# Patient Record
Sex: Male | Born: 1945 | Race: Black or African American | Hispanic: No | Marital: Married
Health system: Southern US, Community
[De-identification: ages and names within clinical notes are randomized; demographics above are authoritative.]

## PROBLEM LIST (undated history)

## (undated) DIAGNOSIS — I10 Essential (primary) hypertension: Secondary | ICD-10-CM

## (undated) DIAGNOSIS — E119 Type 2 diabetes mellitus without complications: Secondary | ICD-10-CM

---

## 2016-04-01 ENCOUNTER — Other Ambulatory Visit: Payer: Self-pay | Admitting: Family Medicine

## 2016-04-01 DIAGNOSIS — R42 Dizziness and giddiness: Secondary | ICD-10-CM

## 2016-04-04 ENCOUNTER — Ambulatory Visit
Admission: RE | Admit: 2016-04-04 | Discharge: 2016-04-04 | Disposition: A | Discharge status: Home / Self Care | Payer: Medicare HMO | Source: Ambulatory Visit | Attending: Family Medicine | Admitting: Family Medicine

## 2016-04-04 DIAGNOSIS — I6521 Occlusion and stenosis of right carotid artery: Secondary | ICD-10-CM | POA: Diagnosis not present

## 2016-04-04 DIAGNOSIS — R42 Dizziness and giddiness: Secondary | ICD-10-CM | POA: Insufficient documentation

## 2019-05-31 ENCOUNTER — Other Ambulatory Visit: Payer: Self-pay | Admitting: Family Medicine

## 2019-05-31 ENCOUNTER — Other Ambulatory Visit (HOSPITAL_COMMUNITY): Payer: Self-pay | Admitting: Family Medicine

## 2019-05-31 DIAGNOSIS — K5792 Diverticulitis of intestine, part unspecified, without perforation or abscess without bleeding: Secondary | ICD-10-CM

## 2019-06-01 ENCOUNTER — Ambulatory Visit
Admission: RE | Admit: 2019-06-01 | Discharge: 2019-06-01 | Disposition: A | Discharge status: Home / Self Care | Payer: Medicare HMO | Source: Ambulatory Visit | Attending: Family Medicine | Admitting: Family Medicine

## 2019-06-01 ENCOUNTER — Other Ambulatory Visit: Payer: Self-pay

## 2019-06-01 DIAGNOSIS — K5792 Diverticulitis of intestine, part unspecified, without perforation or abscess without bleeding: Secondary | ICD-10-CM | POA: Diagnosis not present

## 2019-06-01 HISTORY — DX: Essential (primary) hypertension: I10

## 2019-06-01 HISTORY — DX: Type 2 diabetes mellitus without complications: E11.9

## 2019-06-01 LAB — POCT I-STAT CREATININE: Creatinine, Ser: 1.3 mg/dL — ABNORMAL HIGH (ref 0.61–1.24)

## 2019-06-01 MED ORDER — IOHEXOL 300 MG/ML  SOLN
100.0000 mL | Freq: Once | INTRAMUSCULAR | Status: AC | PRN
  Administered 2019-06-01: 100 mL via INTRAVENOUS

## 2019-07-23 ENCOUNTER — Other Ambulatory Visit: Payer: Self-pay

## 2019-07-23 ENCOUNTER — Other Ambulatory Visit
Admission: RE | Admit: 2019-07-23 | Discharge: 2019-07-23 | Disposition: A | Discharge status: Home / Self Care | Payer: Medicare HMO | Source: Ambulatory Visit | Attending: Gastroenterology | Admitting: Gastroenterology

## 2019-07-27 ENCOUNTER — Encounter: Payer: Self-pay | Admitting: Anesthesiology

## 2019-07-27 ENCOUNTER — Ambulatory Visit
Admission: RE | Admit: 2019-07-27 | Discharge: 2019-07-27 | Disposition: A | Discharge status: Home / Self Care | Payer: Medicare HMO | Attending: Gastroenterology | Admitting: Gastroenterology

## 2019-07-27 ENCOUNTER — Other Ambulatory Visit: Payer: Self-pay

## 2019-07-27 ENCOUNTER — Ambulatory Visit: Payer: Medicare HMO | Admitting: Anesthesiology

## 2019-07-27 ENCOUNTER — Encounter
Admission: RE | Disposition: A | Discharge status: Home / Self Care | Payer: Self-pay | Source: Home / Self Care | Attending: Gastroenterology

## 2019-07-27 SURGERY — COLONOSCOPY WITH PROPOFOL
Anesthesia: General

## 2019-07-27 NOTE — Anesthesia Preprocedure Evaluation (Signed)
Anesthesia Evaluation  Patient identified by MRN, date of birth, ID band Patient awake    Reviewed: Allergy & Precautions, NPO status , Patient's Chart, lab work & pertinent test results  Airway Mallampati: II       Dental   Pulmonary neg pulmonary ROS,           Cardiovascular hypertension,      Neuro/Psych negative neurological ROS  negative psych ROS   GI/Hepatic negative GI ROS, Neg liver ROS,   Endo/Other  diabetes  Renal/GU negative Renal ROS  negative genitourinary   Musculoskeletal negative musculoskeletal ROS (+)   Abdominal   Peds negative pediatric ROS (+)  Hematology negative hematology ROS (+)   Anesthesia Other Findings   Reproductive/Obstetrics                             Anesthesia Physical Anesthesia Plan  ASA: II  Anesthesia Plan: General   Post-op Pain Management:    Induction: Intravenous  PONV Risk Score and Plan: Propofol infusion  Airway Management Planned: Nasal Cannula  Additional Equipment:   Intra-op Plan:   Post-operative Plan:   Informed Consent: I have reviewed the patients History and Physical, chart, labs and discussed the procedure including the risks, benefits and alternatives for the proposed anesthesia with the patient or authorized representative who has indicated his/her understanding and acceptance.       Plan Discussed with: CRNA and Surgeon  Anesthesia Plan Comments:         Anesthesia Quick Evaluation

## 2019-07-30 NOTE — H&P (Signed)
Patient did not have a result for his covid-19 test, none apparently found in the computer.  He was offered a quick tes, but did not want to do that and left.

## 2020-03-03 IMAGING — CT CT ABDOMEN AND PELVIS WITH CONTRAST
2 of 5 series · 16 of 46 positions shown, 18 images · IV contrast (APPLIED)
Comparison: None.

CLINICAL DATA: Right lower quadrant pain for 14 days.

EXAM:
CT ABDOMEN AND PELVIS WITH CONTRAST
TECHNIQUE: Multidetector CT imaging of the abdomen and pelvis was performed
using the standard protocol following bolus administration of
intravenous contrast.
CONTRAST:  100mL OMNIPAQUE IOHEXOL 300 MG/ML  SOLN

[Series 2: routine abd/pel with · axial · 0.68mm/px · z∈[-558,-138]mm · 13 of 94 slices shown, 15 images]
[im 5/94  soft-tissue]
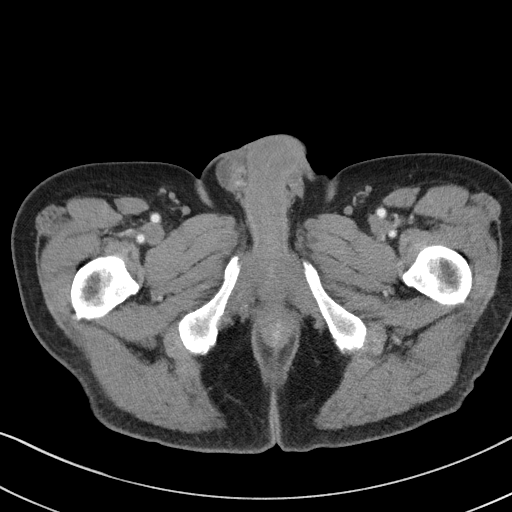
[im 5/94  bone]
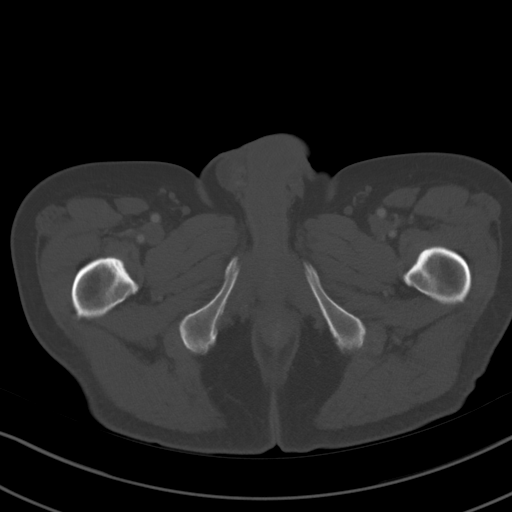
[im 14/94  soft-tissue]
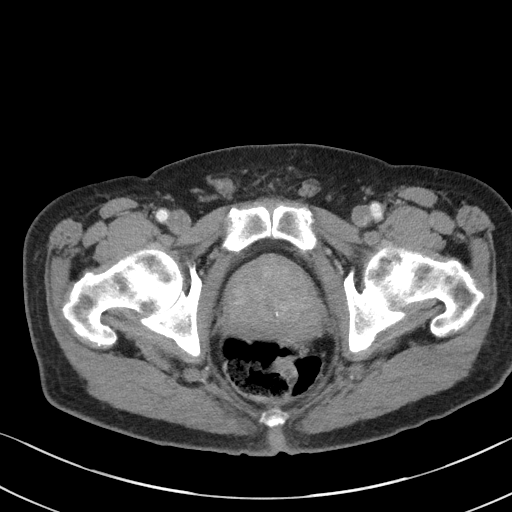
[im 19/94  soft-tissue]
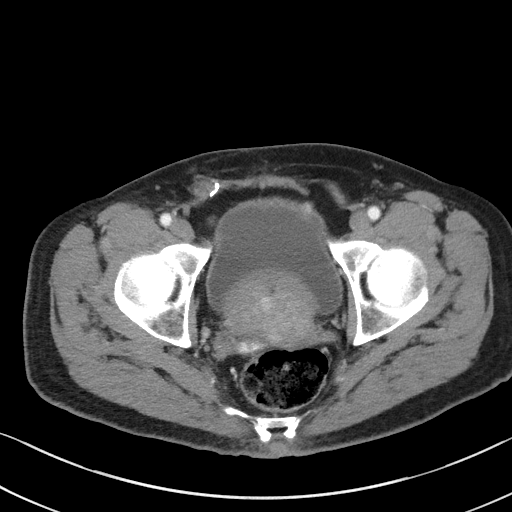
[im 28/94  soft-tissue]
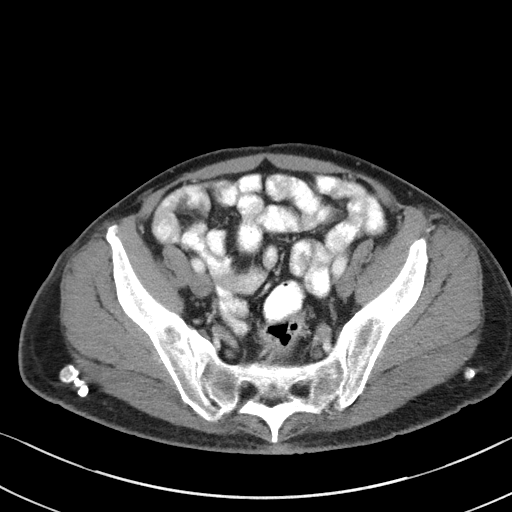
[im 33/94  soft-tissue]
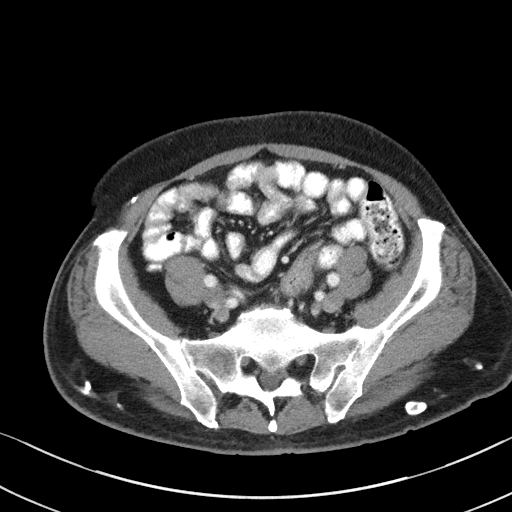
[im 42/94  soft-tissue]
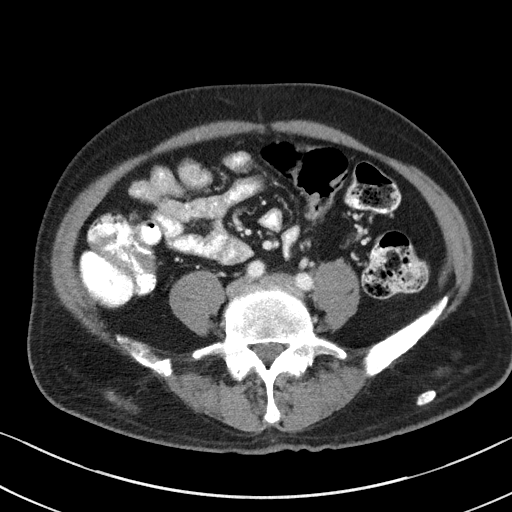
[im 47/94  soft-tissue]
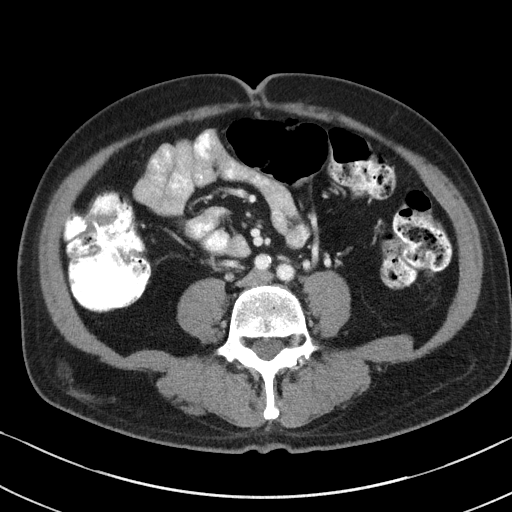
[im 52/94  soft-tissue]
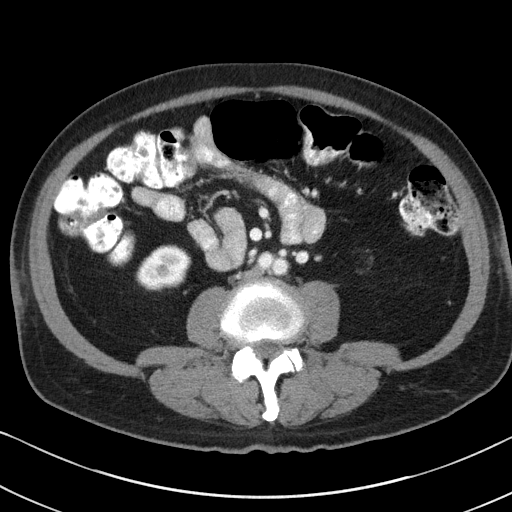
[im 61/94  soft-tissue]
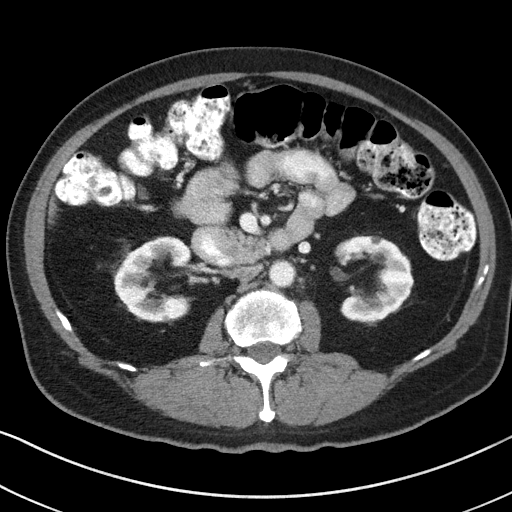
[im 61/94  bone]
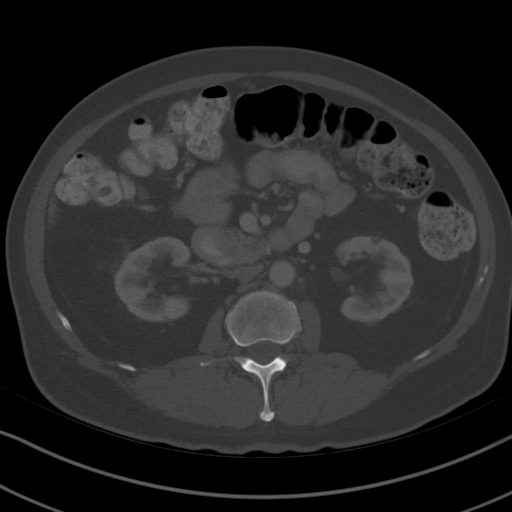
[im 66/94  soft-tissue]
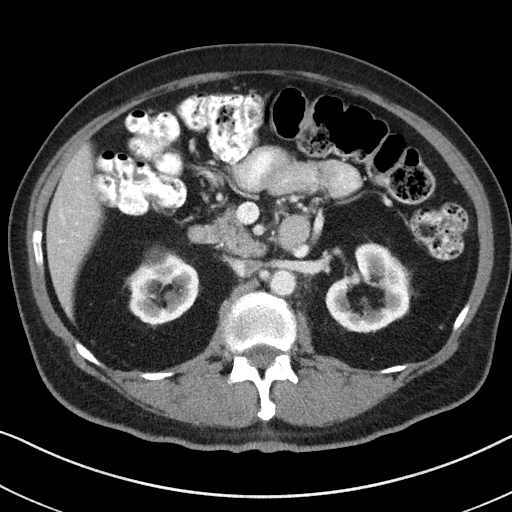
[im 75/94  soft-tissue]
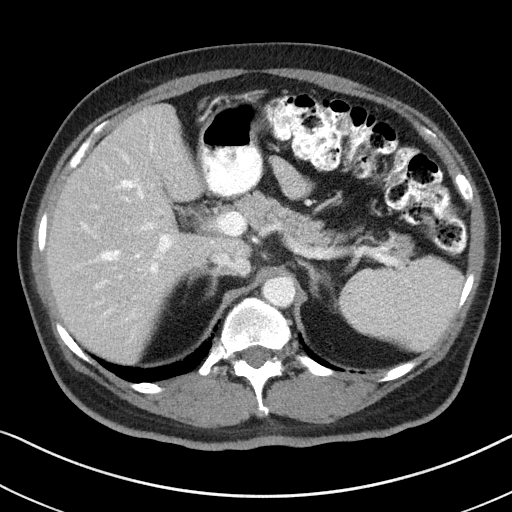
[im 80/94  soft-tissue]
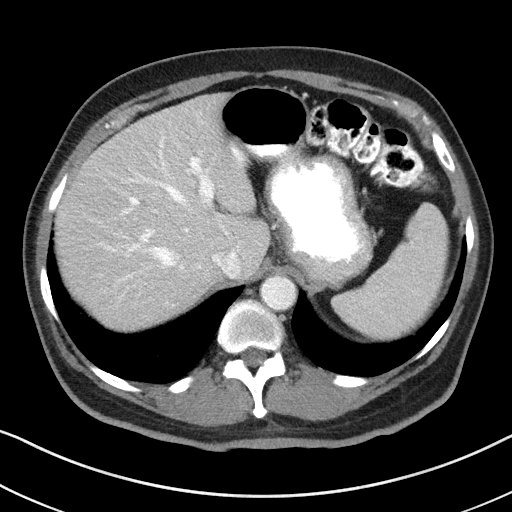
[im 89/94  soft-tissue]
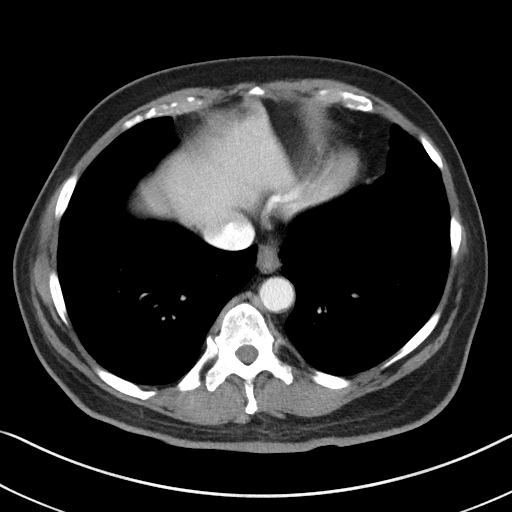

[Series 5: coronal st · coronal · 0.71mm/px · 3 of 95 slices shown]
[im 32/95  soft-tissue]
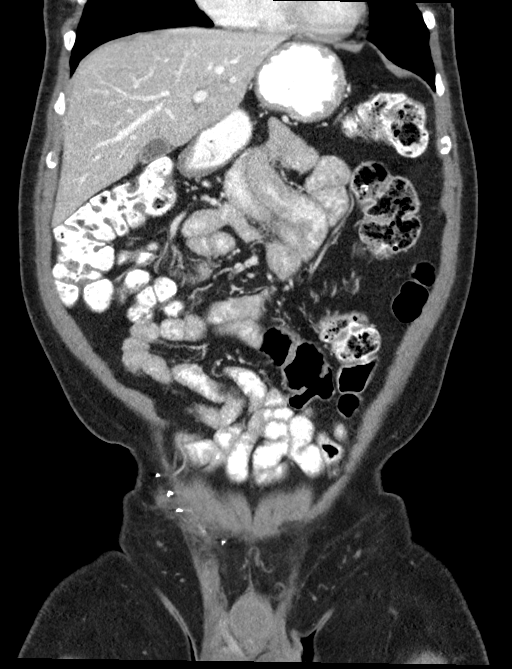
[im 42/95  soft-tissue]
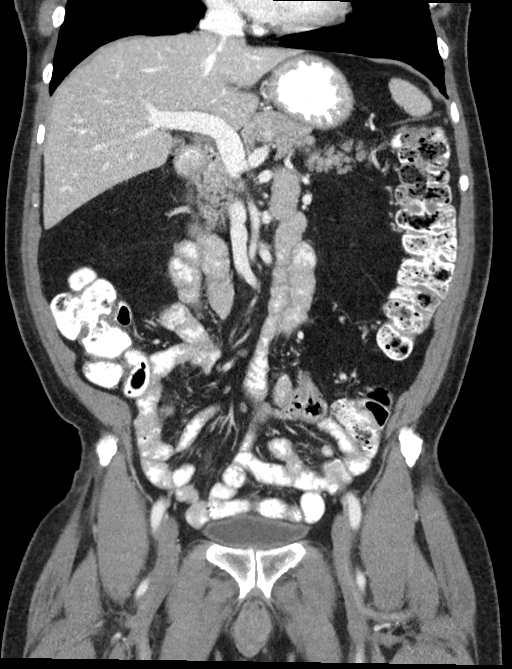
[im 53/95  soft-tissue]
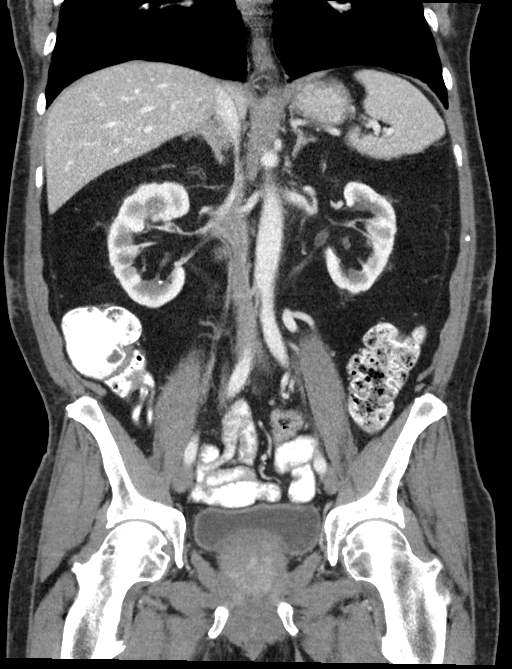

[16 of 46 positions shown; findings below may reference images not displayed]

FINDINGS: Lower chest: No acute abnormality.

Hepatobiliary: No focal liver abnormality is seen. No gallstones,
gallbladder wall thickening, or biliary dilatation.

Pancreas: Unremarkable. No pancreatic ductal dilatation or
surrounding inflammatory changes.

Spleen: Normal in size without focal abnormality.

Adrenals/Urinary Tract: The bilateral adrenal glands are normal.
Simple cysts are identified in bilateral kidneys. There is no
hydronephrosis bilaterally. The bladder is normal.

Stomach/Bowel: Stomach is within normal limits. Appendix appears
normal. No evidence of bowel wall thickening, distention, or
inflammatory changes. Marked bowel content is identified throughout
colon.

Vascular/Lymphatic: Aortic atherosclerosis. No enlarged abdominal or
pelvic lymph nodes.

Reproductive: The prostate gland is enlarged measuring 7.7 x 6.9 cm.

Other: None

Musculoskeletal: No acute abnormality.
IMPRESSION: No acute abnormality identified in the abdomen and pelvis.

The appendix is normal.  There is no bowel obstruction.

Marked bowel content is identified throughout colon. This can be
seen in constipation.

Enlarged prostate gland.

Bilateral kidney cysts.

## 2021-05-09 ENCOUNTER — Other Ambulatory Visit: Payer: Self-pay

## 2021-05-09 ENCOUNTER — Encounter: Payer: Self-pay | Admitting: Emergency Medicine

## 2021-05-09 ENCOUNTER — Emergency Department
Admission: EM | Admit: 2021-05-09 | Discharge: 2021-05-09 | Disposition: A | Discharge status: Home / Self Care | Payer: Medicare HMO | Attending: Student in an Organized Health Care Education/Training Program | Admitting: Student in an Organized Health Care Education/Training Program

## 2021-05-09 ENCOUNTER — Emergency Department: Payer: Medicare HMO

## 2021-05-09 DIAGNOSIS — Z7984 Long term (current) use of oral hypoglycemic drugs: Secondary | ICD-10-CM | POA: Insufficient documentation

## 2021-05-09 DIAGNOSIS — Z79899 Other long term (current) drug therapy: Secondary | ICD-10-CM | POA: Insufficient documentation

## 2021-05-09 DIAGNOSIS — E119 Type 2 diabetes mellitus without complications: Secondary | ICD-10-CM | POA: Insufficient documentation

## 2021-05-09 DIAGNOSIS — Z794 Long term (current) use of insulin: Secondary | ICD-10-CM | POA: Diagnosis not present

## 2021-05-09 DIAGNOSIS — R0789 Other chest pain: Secondary | ICD-10-CM | POA: Diagnosis not present

## 2021-05-09 DIAGNOSIS — I10 Essential (primary) hypertension: Secondary | ICD-10-CM | POA: Insufficient documentation

## 2021-05-09 LAB — CBC
HCT: 39.6 % (ref 39.0–52.0)
Hemoglobin: 14 g/dL (ref 13.0–17.0)
MCH: 32.4 pg (ref 26.0–34.0)
MCHC: 35.4 g/dL (ref 30.0–36.0)
MCV: 91.7 fL (ref 80.0–100.0)
Platelets: 206 10*3/uL (ref 150–400)
RBC: 4.32 MIL/uL (ref 4.22–5.81)
RDW: 11.8 % (ref 11.5–15.5)
WBC: 6.5 10*3/uL (ref 4.0–10.5)
nRBC: 0 % (ref 0.0–0.2)

## 2021-05-09 LAB — BASIC METABOLIC PANEL
Anion gap: 11 (ref 5–15)
BUN: 19 mg/dL (ref 8–23)
CO2: 27 mmol/L (ref 22–32)
Calcium: 9.4 mg/dL (ref 8.9–10.3)
Chloride: 100 mmol/L (ref 98–111)
Creatinine, Ser: 1.6 mg/dL — ABNORMAL HIGH (ref 0.61–1.24)
GFR, Estimated: 44 mL/min — ABNORMAL LOW (ref >60)
Glucose, Bld: 222 mg/dL — ABNORMAL HIGH (ref 70–99)
Potassium: 3.9 mmol/L (ref 3.5–5.1)
Sodium: 138 mmol/L (ref 135–145)

## 2021-05-09 LAB — TROPONIN I (HIGH SENSITIVITY)
Troponin I (High Sensitivity): <2 ng/L (ref <18)
Troponin I (High Sensitivity): <2 ng/L (ref <18)

## 2021-05-09 LAB — D-DIMER, QUANTITATIVE: D-Dimer, Quant: 0.33 ug/mL-FEU (ref 0.00–0.50)

## 2021-05-09 NOTE — ED Provider Notes (Signed)
[facility] Emergency Department Provider Note    Event Date/Time   First MD Initiated Contact with Patient 05/09/21 1128     (approximate)  I have reviewed the triage vital signs and the nursing notes.   HISTORY  Chief Complaint Chest Pain    HPI Erik Richardson is a 75 y.o. male below listed past medical history presents to the ER for evaluation of intermittent brief episodes of left-sided chest pain radiating into his arm.  No associated diaphoresis.  Not exacerbated or alleviated by change and exertion or rest.  Did have some worsening pain when laying on his left side last night.  Denies any injury.  No heavy lifting.  No known history of coronary disease.  Does have a history of diabetes and high blood pressure.  Does not smoke.  Denies any pain shooting or tearing through to his back.  He is currently pain-free.  Past Medical History:  Diagnosis Date   Diabetes mellitus without complication (HCC)    Hypertension    History reviewed. No pertinent family history. History reviewed. No pertinent surgical history. There are no problems to display for this patient.     Prior to Admission medications   Medication Sig Start Date End Date Taking? Authorizing Provider  b complex vitamins tablet Take 1 tablet by mouth daily.    [provider]  clotrimazole (LOTRIMIN) 1 % cream Apply 1 application topically 2 (two) times daily.    [provider]  clotrimazole-betamethasone (LOTRISONE) cream Apply 1 application topically 2 (two) times daily.    [provider]  co-enzyme Q-10 30 MG capsule Take 30 mg by mouth 3 (three) times daily.    [provider]  gabapentin (NEURONTIN) 100 MG capsule Take 100 mg by mouth at bedtime.    [provider]  glimepiride (AMARYL) 2 MG tablet Take 2 mg by mouth daily with breakfast.    [provider]  Glucagon, rDNA, (GLUCAGON EMERGENCY IJ) Inject as  directed.    [provider]  hydrochlorothiazide (HYDRODIURIL) 12.5 MG tablet Take 12.5 mg by mouth daily.    [provider]  insulin detemir (LEVEMIR) 100 UNIT/ML injection Inject 10 Units into the skin daily.    [provider]  irbesartan (AVAPRO) 300 MG tablet Take 300 mg by mouth daily.    [provider]  meclizine (ANTIVERT) 25 MG tablet Take 25 mg by mouth 3 (three) times daily as needed for dizziness.    [provider]  metFORMIN (GLUCOPHAGE) 1000 MG tablet Take 1,000 mg by mouth 2 (two) times daily with a meal.    [provider]  mometasone (NASONEX) 50 MCG/ACT nasal spray Place 2 sprays into the nose daily.    [provider]  Multiple Vitamin (MULTIVITAMIN) capsule Take 1 capsule by mouth daily.    [provider]    Allergies Patient has no known allergies.    Social History    Review of Systems Patient denies headaches, rhinorrhea, blurry vision, numbness, shortness of breath, chest pain, edema, cough, abdominal pain, nausea, vomiting, diarrhea, dysuria, fevers, rashes or hallucinations unless otherwise stated above in HPI. ____________________________________________   PHYSICAL EXAM:  VITAL SIGNS: Vitals:   05/09/21 1114  BP: (!) 167/103  Pulse: 96  Resp: 17  Temp: 98.4 F (36.9 C)  SpO2: 99%    Constitutional: Alert and oriented.  Eyes: Conjunctivae are normal.  Head: Atraumatic. Nose: No congestion/rhinnorhea. Mouth/Throat: Mucous membranes are moist.  Neck: No stridor. Painless ROM.  Cardiovascular: Normal rate, regular rhythm. Grossly normal heart sounds.  Good peripheral circulation. Respiratory: Normal respiratory effort.  No retractions. Lungs CTAB. Gastrointestinal: Soft and nontender. No distention. No abdominal bruits. No CVA tenderness. Genitourinary:  Musculoskeletal: No lower extremity tenderness nor edema.  No joint effusions. Neurologic:  Normal speech and  language. No gross focal neurologic deficits are appreciated. No facial droop Skin:  Skin is warm, dry and intact. No rash noted. Psychiatric: Mood and affect are normal. Speech and behavior are normal.  ____________________________________________   LABS (all labs ordered are listed, but only abnormal results are displayed)  Results for orders placed or performed during the hospital encounter of 05/09/21 (from the past 24 hour(s))  Basic metabolic panel     Status: Abnormal   Collection Time: 05/09/21 11:11 AM  Result Value Ref Range   Sodium 138 135 - 145 mmol/L   Potassium 3.9 3.5 - 5.1 mmol/L   Chloride 100 98 - 111 mmol/L   CO2 27 22 - 32 mmol/L   Glucose, Bld 222 (H) 70 - 99 mg/dL   BUN 19 8 - 23 mg/dL   Creatinine, Ser 1.61 (H) 0.61 - 1.24 mg/dL   Calcium 9.4 8.9 - 09.6 mg/dL   GFR, Estimated 44 (L) >60 mL/min   Anion gap 11 5 - 15  CBC     Status: None   Collection Time: 05/09/21 11:11 AM  Result Value Ref Range   WBC 6.5 4.0 - 10.5 K/uL   RBC 4.32 4.22 - 5.81 MIL/uL   Hemoglobin 14.0 13.0 - 17.0 g/dL   HCT 04.5 40.9 - 81.1 %   MCV 91.7 80.0 - 100.0 fL   MCH 32.4 26.0 - 34.0 pg   MCHC 35.4 30.0 - 36.0 g/dL   RDW 91.4 78.2 - 95.6 %   Platelets 206 150 - 400 K/uL   nRBC 0.0 0.0 - 0.2 %  Troponin I (High Sensitivity)     Status: None   Collection Time: 05/09/21 11:11 AM  Result Value Ref Range   Troponin I (High Sensitivity) <2 <18 ng/L  D-dimer, quantitative     Status: None   Collection Time: 05/09/21 12:36 PM  Result Value Ref Range   D-Dimer, Quant 0.33 0.00 - 0.50 ug/mL-FEU  Troponin I (High Sensitivity)     Status: None   Collection Time: 05/09/21  1:54 PM  Result Value Ref Range   Troponin I (High Sensitivity) <2 <18 ng/L   ____________________________________________  EKG My review and personal interpretation at Time: 11:08   Indication: chest pain  Rate: 90  Rhythm: sinus Axis: normal Other: normal intervals, no stemi, no  depressions ____________________________________________  RADIOLOGY  I personally reviewed all radiographic images ordered to evaluate for the above acute complaints and reviewed radiology reports and findings.  These findings were personally discussed with the patient.  Please see medical record for radiology report.  ____________________________________________   PROCEDURES  Procedure(s) performed:  Procedures    Critical Care performed: no ____________________________________________   INITIAL IMPRESSION / ASSESSMENT AND PLAN / ED COURSE  Pertinent labs & imaging results that were available during my care of the patient were reviewed by me and considered in my medical decision making (see chart for details).   DDX: ACS, pericarditis, esophagitis, pe, dissection, pna, bronchitis, costochondritis   Erik Richardson is a 75 y.o. who presents to the ED with chest pain symptoms as described above.  Patient clinically very well-appearing mildly  hypertensive but in no acute distress.  No significant respiratory symptoms.  Has good air movement throughout.  No sign of pneumothorax.  Have a lower suspicion for PE will order D-dimer to further stratify.  His EKG is nonischemic appearing initial troponin is negative after 24 hours of intermittent pain.  I think this is most likely noncardiac pain but given his age and risk factors will order serial enzymes.  He has a low heart score of 3.  We will continue to observe.  His abdominal exam soft and benign.  Clinical Course as of 05/09/21 1436  Wed May 09, 2021  1434 Patient reassessed.  He feels well.  Serial enzymes are negative.  D-dimer negative.  As he is pain-free otherwise well-appearing with reassuring work-up I do believe he stable and appropriate for close outpatient follow-up.  Patient agreeable to plan.  Discussed signs and symptoms for which she should return to the ER. [PR]    Clinical Course User Index [PR]  Willy Eddy, MD    The patient was evaluated in Emergency Department today for the symptoms described in the history of present illness. He/she was evaluated in the context of the global COVID-19 pandemic, which necessitated consideration that the patient might be at risk for infection with the SARS-CoV-2 virus that causes COVID-19. Institutional protocols and algorithms that pertain to the evaluation of patients at risk for COVID-19 are in a state of rapid change based on information released by regulatory bodies including the CDC and federal and state organizations. These policies and algorithms were followed during the patient's care in the ED.  As part of my medical decision making, I reviewed the following data within the electronic MEDICAL RECORD NUMBER Nursing notes reviewed and incorporated, Labs reviewed, notes from prior ED visits and [place] Controlled Substance Database   ____________________________________________   FINAL CLINICAL IMPRESSION(S) / ED DIAGNOSES  Final diagnoses:  Atypical chest pain      NEW MEDICATIONS STARTED DURING THIS VISIT:  New Prescriptions   No medications on file     Note:  This document was prepared using Dragon voice recognition software and may include unintentional dictation errors.    Willy Eddy, MD 05/09/21 [phone]

## 2021-05-09 NOTE — ED Triage Notes (Signed)
Pt comes into the ED via POV c/o left side chest pain.  Pt states the pain started yesterday morning.  Pt states it radiates into the shoulder and left arm.  Pt denies any SHOB, dizziness, or nausea.  Pt ambulatory to triage with even and unlabored respirations.  PT denies any significant known cardiac history.

## 2021-05-09 NOTE — ED Notes (Signed)
Pt resting in bed, denies any pain at this time.

## 2022-02-09 IMAGING — CR DG CHEST 2V
1 series · 2 of 2 positions shown · non-contrast
Comparison: None.

CLINICAL DATA: Chest pain

EXAM:
CHEST - 2 VIEW

[Series 1: w chest pa · 0.14mm/px · 2 of 2 slices shown]
[im 1/2]
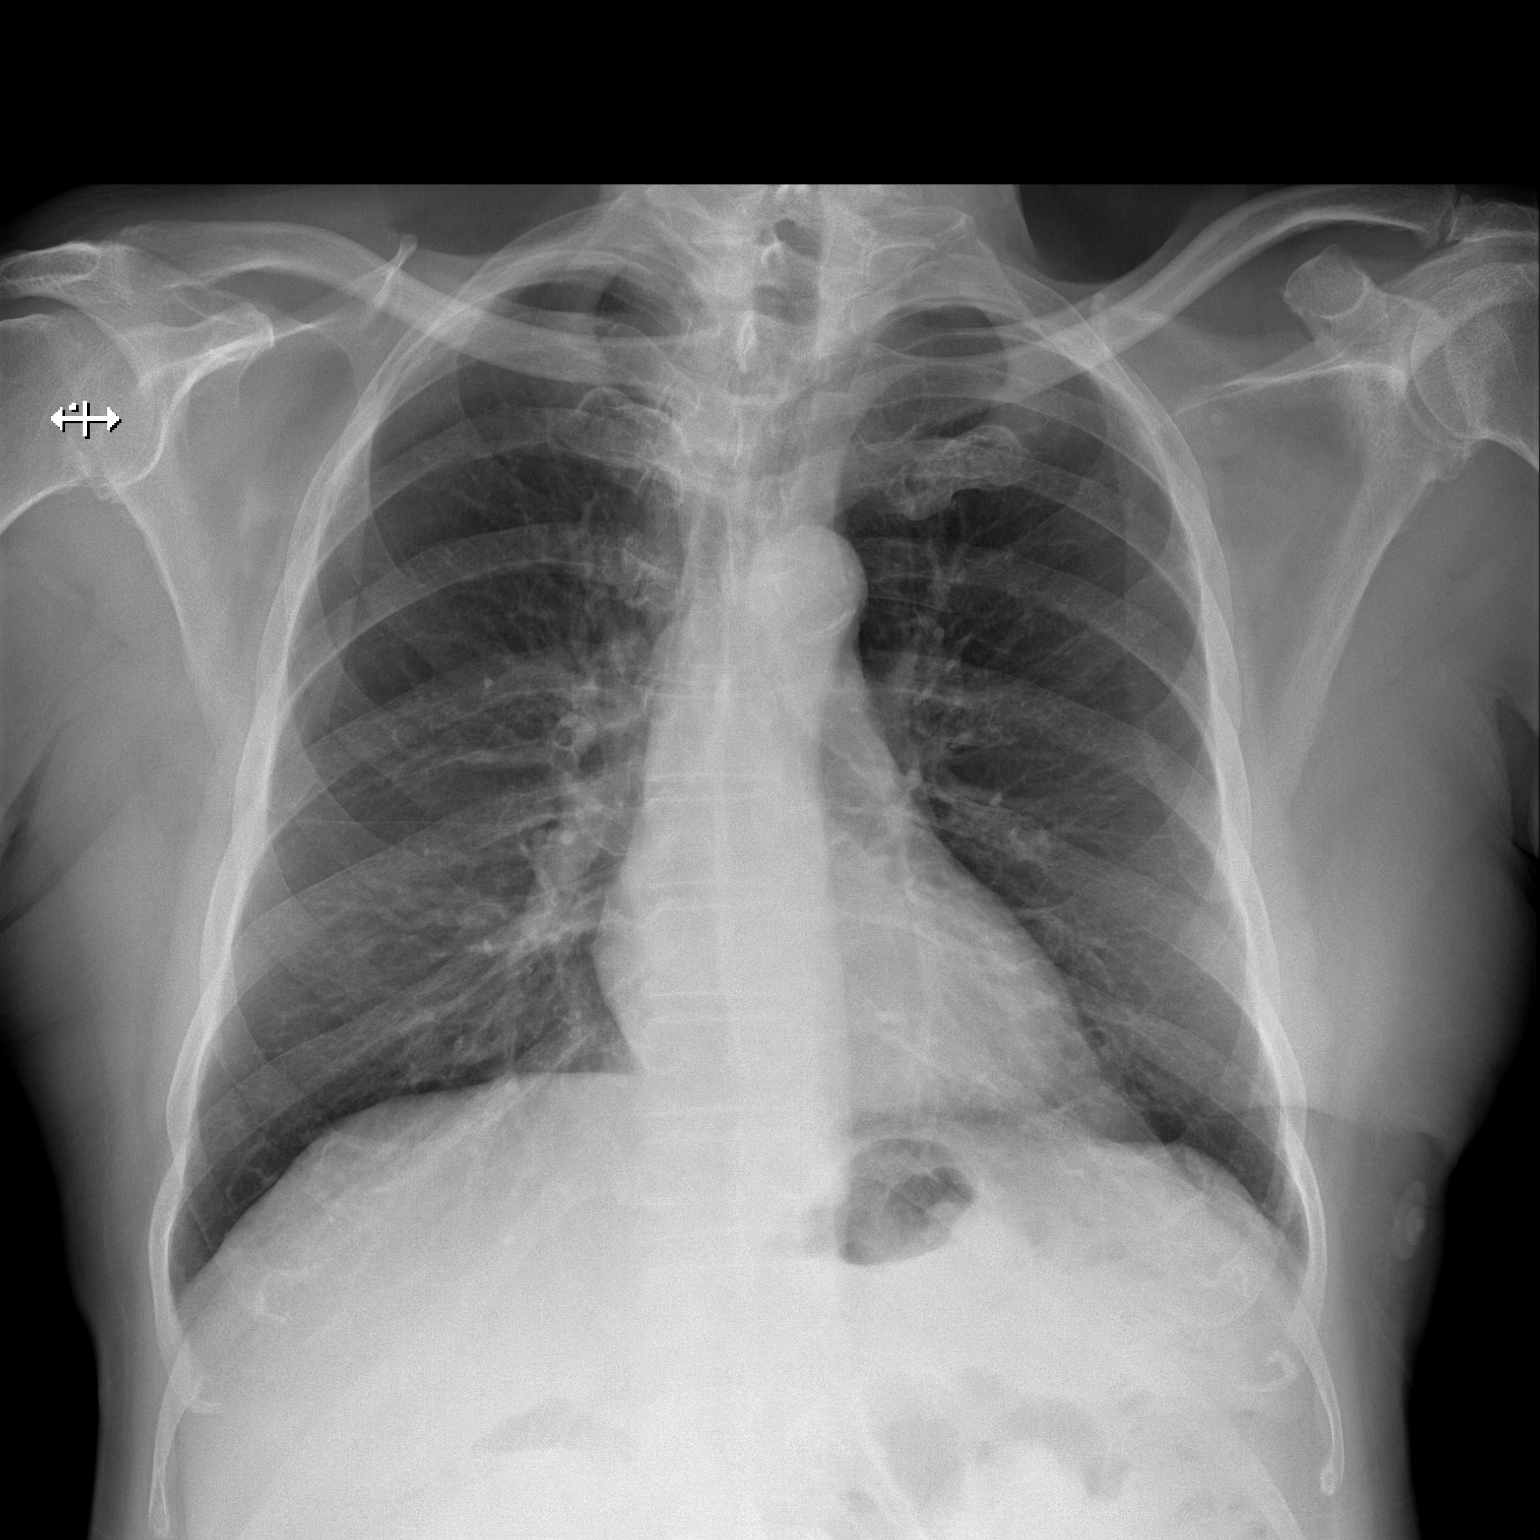
[im 2/2]
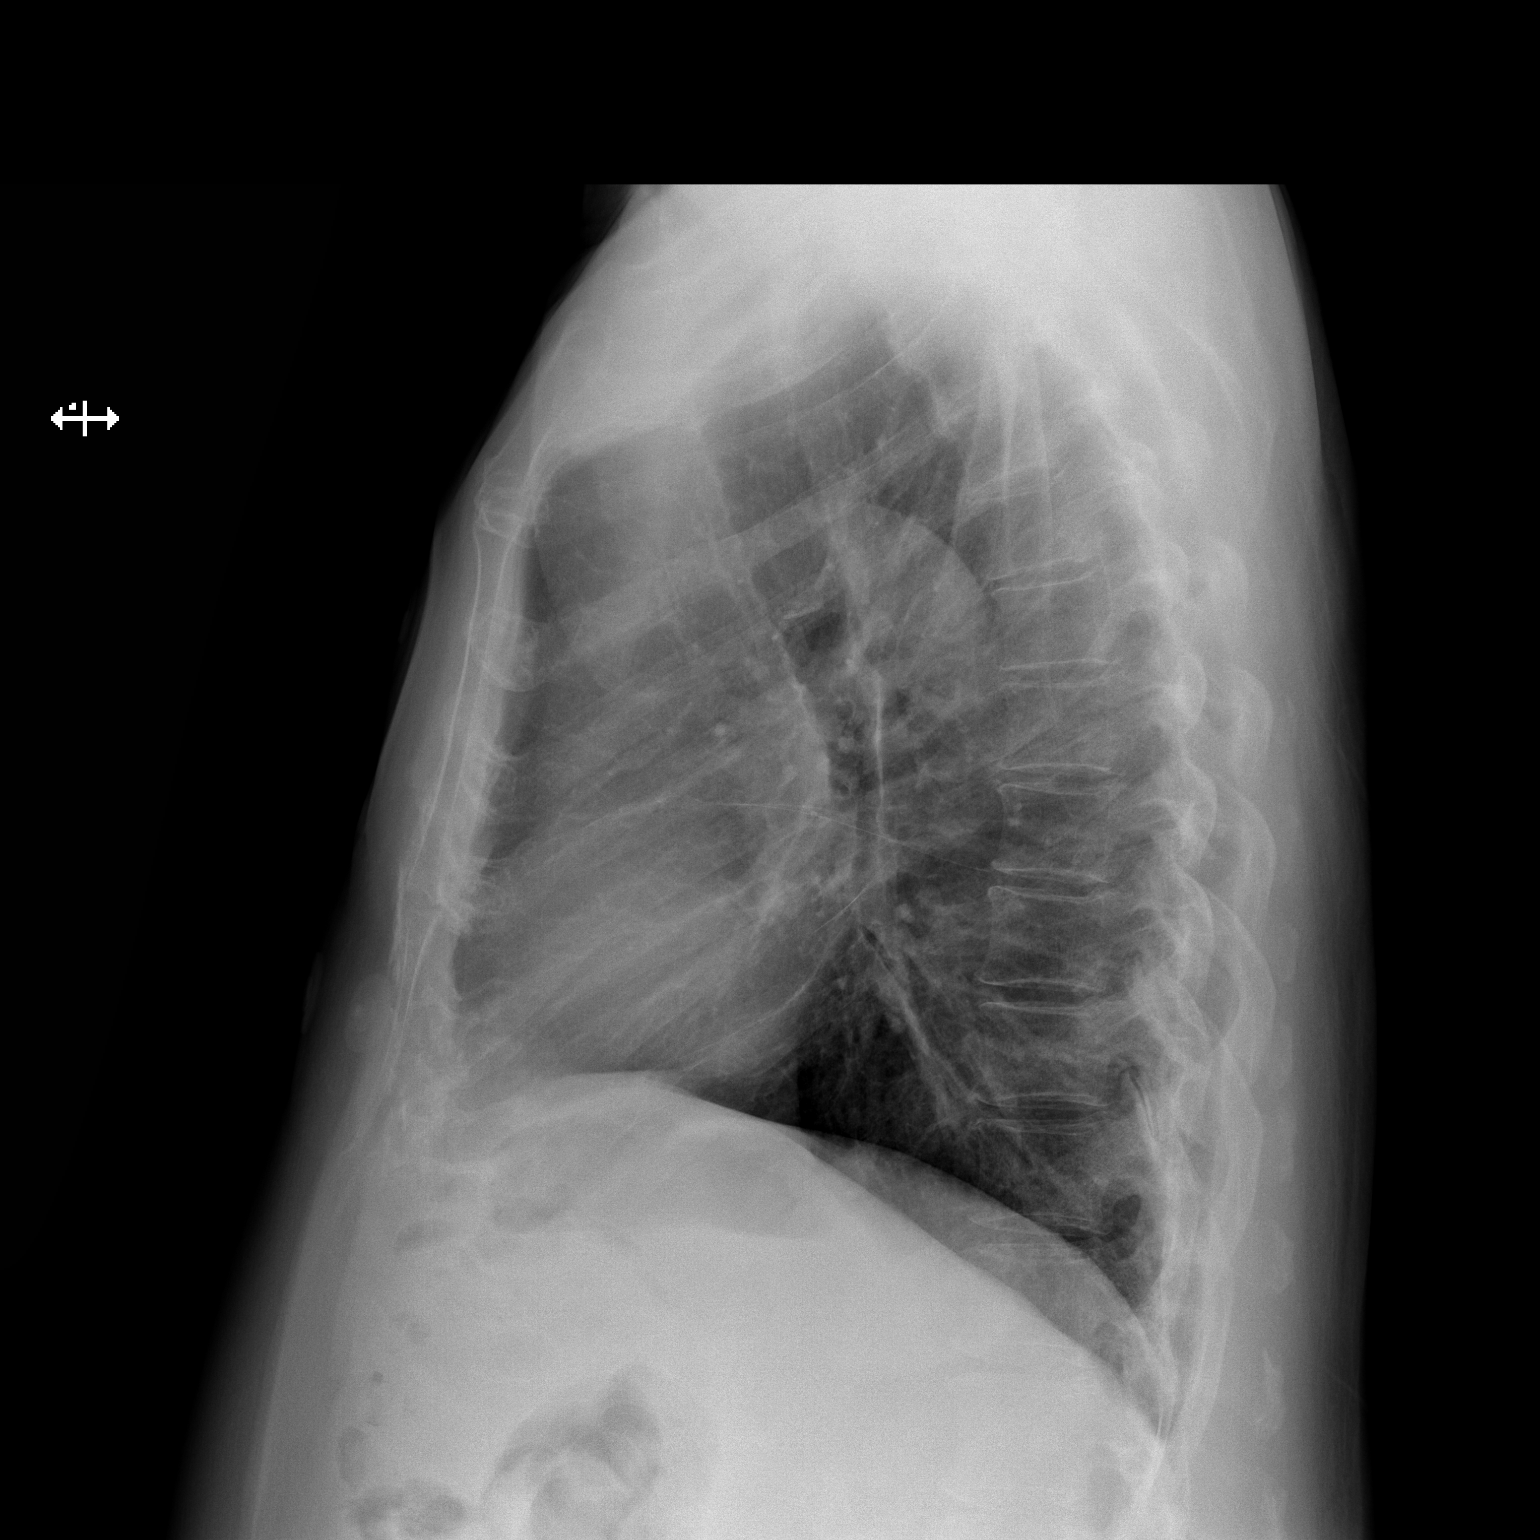

[2 of 2 positions shown; findings below may reference images not displayed]

FINDINGS: The heart size and mediastinal contours are within normal limits.
Both lungs are clear. The visualized skeletal structures are
unremarkable.
IMPRESSION: No active cardiopulmonary disease.
